# Patient Record
Sex: Male | Born: 2000 | Race: Black or African American | Hispanic: No | Marital: Single | Smoking: Never smoker
Health system: Southern US, Community
[De-identification: ages and names within clinical notes are randomized; demographics above are authoritative.]

## PROBLEM LIST (undated history)

## (undated) DIAGNOSIS — J45909 Unspecified asthma, uncomplicated: Secondary | ICD-10-CM

## (undated) HISTORY — PX: WRIST FRACTURE SURGERY: SHX121

## (undated) HISTORY — DX: Unspecified asthma, uncomplicated: J45.909

---
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
## 2018-01-08 ENCOUNTER — Encounter: Payer: Self-pay | Admitting: Pediatrics

## 2018-01-08 ENCOUNTER — Ambulatory Visit (INDEPENDENT_AMBULATORY_CARE_PROVIDER_SITE_OTHER): Payer: Medicaid Other

## 2018-01-08 ENCOUNTER — Other Ambulatory Visit: Payer: Self-pay | Admitting: Pediatrics

## 2018-01-08 ENCOUNTER — Ambulatory Visit (INDEPENDENT_AMBULATORY_CARE_PROVIDER_SITE_OTHER): Payer: Medicaid Other | Admitting: Pediatrics

## 2018-01-08 VITALS — BP 125/72 | HR 64 | Temp 98.0°F | Ht 70.0 in | Wt 196.4 lb

## 2018-01-08 DIAGNOSIS — S93401A Sprain of unspecified ligament of right ankle, initial encounter: Secondary | ICD-10-CM | POA: Diagnosis not present

## 2018-01-08 DIAGNOSIS — M25571 Pain in right ankle and joints of right foot: Secondary | ICD-10-CM

## 2018-01-08 NOTE — Progress Notes (Addendum)
  Subjective:   Patient ID: Ray [name], male    DOB: [birth year], 17 y.o.   MRN: [id number] CC: Ankle Pain (right ankle pain, playing basketball, landed on anothers foot on Saturday)  HPI: Ray [name] is a 17 y.o. male presenting for Ankle Pain (right ankle pain, playing basketball, landed on anothers foot on Saturday)  Two days ago with injury as above Hurts medial ankle now Thinks he heard a pop when he fell Doesn't remember how ankle twisted when he fell Not able to put much weight on it, had to stop playing that day Was able to go to work the next day but had to "hobble around" on foot bc normal walking hurt Today walking does not hurt as much, can walk normally Helped to have ankle wrapped, had less pain Has a game tomorrow and game end of the week  Relevant past medical, surgical, family and social history reviewed. Allergies and medications reviewed and updated. Social History   Tobacco Use  Smoking Status Never Smoker  Smokeless Tobacco Never Used   ROS: Per HPI   Objective:    BP 125/72   Pulse 64   Temp 98 F (36.7 C) (Oral)   Ht 5\' 10"  (1.778 m)   Wt 196 lb 6.4 oz (89.1 kg)   BMI 28.18 kg/m   Wt Readings from Last 3 Encounters:  01/08/18 196 lb 6.4 oz (89.1 kg) (92 %, Z= 1.40)*  01/26/17 193 lb (87.5 kg) (92 %, Z= 1.42)*  08/04/16 179 lb 6.4 oz (81.4 kg) (87 %, Z= 1.15)*   * Growth percentiles are based on CDC (Boys, 2-20 Years) data.    Gen: NAD, alert, cooperative with exam, NCAT EYES: EOMI, no conjunctival injection, or no icterus CV:  distal pulses 2+ b/l Resp: normal WOB Neuro: Alert and oriented MSK: R ankle swollen throughout. ttp medial malleolus No pain with ankle R ankle ROM, dorsi/plantarflex against resistance, eversion, inversion with resistance with no pain  XRAY R ankle: no fracture or dislocation  Assessment & Plan:  [name] was seen today for ankle pain.  Diagnoses and all orders for this visit:  Sprain of right ankle,  unspecified ligament, initial encounter Rest, ICE, NSAIDs, ankle wrap Note for gym/basketball given, OK to return to play 2/1 as long as symptoms have resolved If pain ongoing rtc 2 weeks for repeat xray  Follow up plan: As needed Rex Krasarol Amna Welker, MD [facility]

## 2018-09-19 ENCOUNTER — Ambulatory Visit (INDEPENDENT_AMBULATORY_CARE_PROVIDER_SITE_OTHER): Payer: Self-pay | Admitting: *Deleted

## 2018-09-19 DIAGNOSIS — Z23 Encounter for immunization: Secondary | ICD-10-CM

## 2018-09-26 ENCOUNTER — Ambulatory Visit: Payer: Medicaid Other

## 2019-10-09 ENCOUNTER — Ambulatory Visit: Payer: Medicaid Other

## 2019-10-09 ENCOUNTER — Other Ambulatory Visit: Payer: Self-pay

## 2019-10-24 ENCOUNTER — Other Ambulatory Visit: Payer: Self-pay

## 2019-10-25 ENCOUNTER — Ambulatory Visit (INDEPENDENT_AMBULATORY_CARE_PROVIDER_SITE_OTHER): Payer: Self-pay

## 2019-10-25 DIAGNOSIS — Z23 Encounter for immunization: Secondary | ICD-10-CM

## 2020-08-04 ENCOUNTER — Telehealth: Payer: Self-pay | Admitting: Family Medicine

## 2020-08-04 NOTE — Telephone Encounter (Signed)
Appt made for Friday for STD screening

## 2020-08-07 ENCOUNTER — Encounter: Payer: Self-pay | Admitting: Family Medicine

## 2020-08-07 ENCOUNTER — Ambulatory Visit (INDEPENDENT_AMBULATORY_CARE_PROVIDER_SITE_OTHER): Payer: Self-pay | Admitting: Family Medicine

## 2020-08-07 ENCOUNTER — Other Ambulatory Visit (HOSPITAL_COMMUNITY)
Admission: RE | Admit: 2020-08-07 | Discharge: 2020-08-07 | Disposition: A | Discharge status: Home / Self Care | Payer: 59 | Source: Ambulatory Visit | Attending: Family Medicine | Admitting: Family Medicine

## 2020-08-07 ENCOUNTER — Other Ambulatory Visit: Payer: Self-pay

## 2020-08-07 VITALS — BP 136/85 | HR 68 | Temp 96.9°F | Ht 70.0 in | Wt 214.6 lb

## 2020-08-07 DIAGNOSIS — R369 Urethral discharge, unspecified: Secondary | ICD-10-CM

## 2020-08-07 DIAGNOSIS — R300 Dysuria: Secondary | ICD-10-CM

## 2020-08-07 DIAGNOSIS — Z113 Encounter for screening for infections with a predominantly sexual mode of transmission: Secondary | ICD-10-CM | POA: Diagnosis not present

## 2020-08-07 DIAGNOSIS — R8290 Unspecified abnormal findings in urine: Secondary | ICD-10-CM

## 2020-08-07 LAB — URINALYSIS, COMPLETE
Bilirubin, UA: NEGATIVE
Glucose, UA: NEGATIVE
Ketones, UA: NEGATIVE
Nitrite, UA: NEGATIVE
Specific Gravity, UA: >1.03 — ABNORMAL HIGH (ref 1.005–1.030)
Urobilinogen, Ur: 0.2 mg/dL (ref 0.2–1.0)
pH, UA: 5.5 (ref 5.0–7.5)

## 2020-08-07 LAB — MICROSCOPIC EXAMINATION
Bacteria, UA: NONE SEEN
Epithelial Cells (non renal): NONE SEEN /hpf (ref 0–10)
WBC, UA: >30 /hpf — AB (ref 0–5)

## 2020-08-07 NOTE — Progress Notes (Signed)
   Assessment & Plan:  1. Dysuria - Urinalysis, Complete - Urine dipstick shows positive for RBC's, leukocytes, and positive for protein.  Micro exam: >30 WBC's per HPF, 0-2 RBC's per HPF and no bacteria. - HIV Antibody (routine testing w rflx) - HCV Ab w Reflex to Quant PCR - RPR - Urine cytology ancillary only  2. Abnormal urinalysis - Urine Culture  3. Penile discharge - HIV Antibody (routine testing w rflx) - HCV Ab w Reflex to Quant PCR - RPR - Urine cytology ancillary only   Follow up plan: Return if symptoms worsen or fail to improve.  Deliah Boston, MSN, APRN, FNP-C [facility]  Subjective:   Patient ID: Ray [name], male    DOB: [birth year], 19 y.o.   MRN: [id number]  HPI: Ray [name] is a 19 y.o. male presenting on 08/07/2020 for Dysuria (x 2 weeks but stopped a week ago.  Patient took AZO and states it cleared up.) and Penile Discharge (x 2 weeks)  Patient reports dysuria and penile discharge x2 weeks. Denies having a new sex partner. He has been taking AZO which helps with the dysuria. Discharge is while in color.    ROS: Negative unless specifically indicated above in HPI.   Relevant past medical history reviewed and updated as indicated.   Allergies and medications reviewed and updated.  No current outpatient medications on file.  No Known Allergies  Objective:   BP 136/85   Pulse 68   Temp (!) 96.9 F (36.1 C) (Temporal)   Ht 5\' 10"  (1.778 m)   Wt 214 lb 9.6 oz (97.3 kg)   SpO2 97%   BMI 30.79 kg/m    Physical Exam Vitals reviewed.  Constitutional:      General: He is not in acute distress.    Appearance: Normal appearance. He is obese. He is not ill-appearing, toxic-appearing or diaphoretic.  HENT:     Head: Normocephalic and atraumatic.  Eyes:     General: No scleral icterus.       Right eye: No discharge.        Left eye: No discharge.     Conjunctiva/sclera: Conjunctivae normal.  Cardiovascular:      Rate and Rhythm: Normal rate.  Pulmonary:     Effort: Pulmonary effort is normal. No respiratory distress.  Musculoskeletal:        General: Normal range of motion.     Cervical back: Normal range of motion.  Skin:    General: Skin is warm and dry.  Neurological:     Mental Status: He is alert and oriented to person, place, and time. Mental status is at baseline.  Psychiatric:        Mood and Affect: Mood normal.        Behavior: Behavior normal.        Thought Content: Thought content normal.        Judgment: Judgment normal.

## 2020-08-08 LAB — HCV INTERPRETATION

## 2020-08-08 LAB — RPR: RPR Ser Ql: NONREACTIVE

## 2020-08-08 LAB — HCV AB W REFLEX TO QUANT PCR: HCV Ab: <0.1 s/co ratio (ref 0.0–0.9)

## 2020-08-08 LAB — HIV ANTIBODY (ROUTINE TESTING W REFLEX): HIV Screen 4th Generation wRfx: NONREACTIVE

## 2020-08-09 LAB — URINE CULTURE: Organism ID, Bacteria: NO GROWTH

## 2020-08-10 ENCOUNTER — Encounter: Payer: Self-pay | Admitting: Family Medicine

## 2020-08-10 LAB — URINE CYTOLOGY ANCILLARY ONLY
Chlamydia: POSITIVE — AB
Comment: NEGATIVE
Comment: NEGATIVE
Comment: NORMAL
Neisseria Gonorrhea: POSITIVE — AB
Trichomonas: NEGATIVE

## 2020-08-11 ENCOUNTER — Other Ambulatory Visit: Payer: Self-pay | Admitting: Family Medicine

## 2020-08-11 DIAGNOSIS — A549 Gonococcal infection, unspecified: Secondary | ICD-10-CM

## 2020-08-11 DIAGNOSIS — A749 Chlamydial infection, unspecified: Secondary | ICD-10-CM

## 2020-08-11 MED ORDER — CEFTRIAXONE SODIUM 250 MG IJ SOLR
250.0000 mg | Freq: Once | INTRAMUSCULAR | Status: AC
  Administered 2020-08-26: 250 mg via INTRAMUSCULAR

## 2020-08-11 MED ORDER — AZITHROMYCIN 500 MG PO TABS: 1000.0000 mg | ORAL_TABLET | Freq: Once | ORAL | 0 refills | Status: AC

## 2020-08-25 ENCOUNTER — Telehealth: Payer: Self-pay

## 2020-08-25 ENCOUNTER — Telehealth: Payer: Self-pay | Admitting: Family Medicine

## 2020-08-25 NOTE — Telephone Encounter (Signed)
Patient spoke with someone in regards to labs.

## 2020-08-25 NOTE — Telephone Encounter (Signed)
Attempted to contact patient again about his lab results - LMTCB  Patient has not called [place] back so I am sending a letter for patient to call about his lab results.   Letter mailed

## 2020-08-25 NOTE — Telephone Encounter (Signed)
I did confirm he picked up the azithromycin from [facility] pharmacy, but he still needs the shot of Rocephin and then a test of cure in 2 weeks. I have spoken to his grandmother Margaretha Glassing, who will give him a message to call us back.

## 2020-08-26 ENCOUNTER — Ambulatory Visit (INDEPENDENT_AMBULATORY_CARE_PROVIDER_SITE_OTHER): Payer: Medicaid Other | Admitting: *Deleted

## 2020-08-26 ENCOUNTER — Other Ambulatory Visit: Payer: Self-pay

## 2020-08-26 DIAGNOSIS — A549 Gonococcal infection, unspecified: Secondary | ICD-10-CM | POA: Diagnosis not present

## 2020-08-26 DIAGNOSIS — A749 Chlamydial infection, unspecified: Secondary | ICD-10-CM | POA: Diagnosis not present

## 2020-08-26 NOTE — Progress Notes (Signed)
Pt tolerated rocephin injection well. No side effect noted, pt stayed in office for 15 min.

## 2020-08-27 NOTE — Telephone Encounter (Signed)
Patient came in 9/15 for shot.

## 2020-11-12 ENCOUNTER — Other Ambulatory Visit: Payer: 59

## 2020-11-12 DIAGNOSIS — Z20822 Contact with and (suspected) exposure to covid-19: Secondary | ICD-10-CM

## 2020-11-13 LAB — NOVEL CORONAVIRUS, NAA: SARS-CoV-2, NAA: NOT DETECTED

## 2020-11-13 LAB — SARS-COV-2, NAA 2 DAY TAT
